# Patient Record
Sex: Male | Born: 1945 | Hispanic: No | Marital: Married
Health system: Southern US, Community
[De-identification: ages and names within clinical notes are randomized; demographics above are authoritative.]

---

## 2006-07-07 ENCOUNTER — Ambulatory Visit: Payer: Self-pay | Admitting: Cardiothoracic Surgery

## 2013-12-05 ENCOUNTER — Other Ambulatory Visit: Payer: Self-pay | Admitting: Internal Medicine

## 2013-12-05 DIAGNOSIS — N189 Chronic kidney disease, unspecified: Secondary | ICD-10-CM

## 2013-12-07 ENCOUNTER — Other Ambulatory Visit: Payer: Self-pay

## 2013-12-19 ENCOUNTER — Ambulatory Visit
Admission: RE | Admit: 2013-12-19 | Discharge: 2013-12-19 | Disposition: A | Discharge status: Home / Self Care | Payer: MEDICARE | Source: Ambulatory Visit | Attending: Internal Medicine | Admitting: Internal Medicine

## 2013-12-19 DIAGNOSIS — N189 Chronic kidney disease, unspecified: Secondary | ICD-10-CM

## 2015-09-10 IMAGING — US US RENAL
1 series · 14 of 25 positions shown · non-contrast
Comparison: None.

CLINICAL DATA: Chronic kidney disease.

EXAM:
RENAL/URINARY TRACT ULTRASOUND COMPLETE

[Series 1: us renal · 0.26mm/px · 14 of 44 slices shown]
[im 1/44]
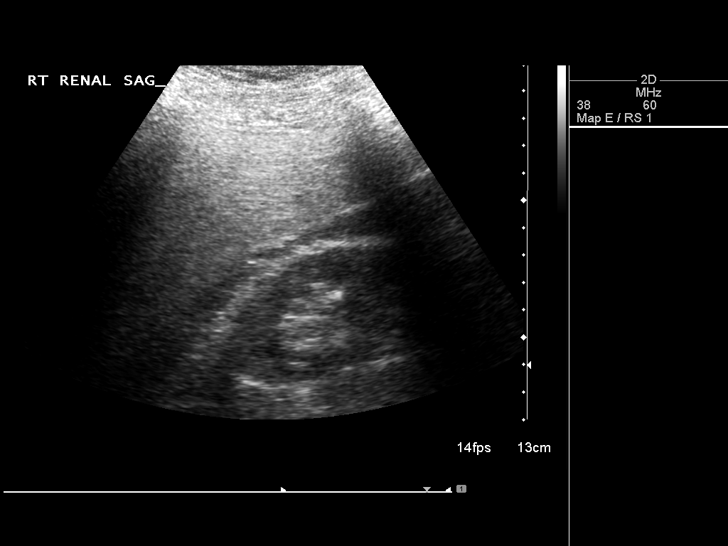
[im 4/44]
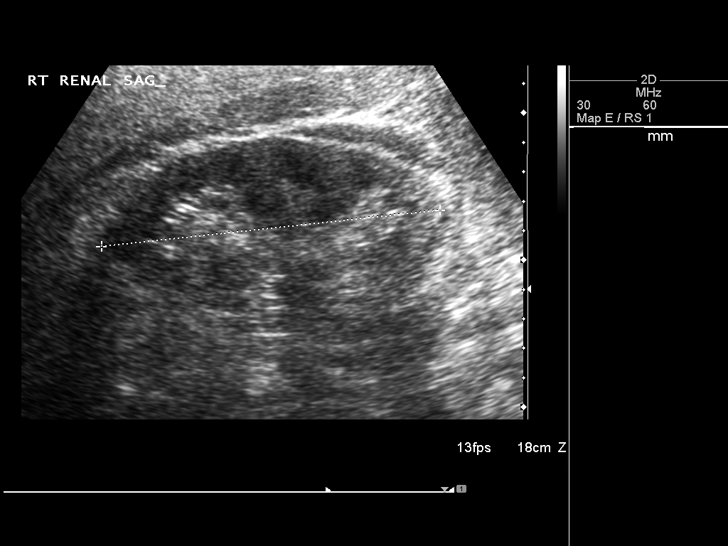
[im 8/44]
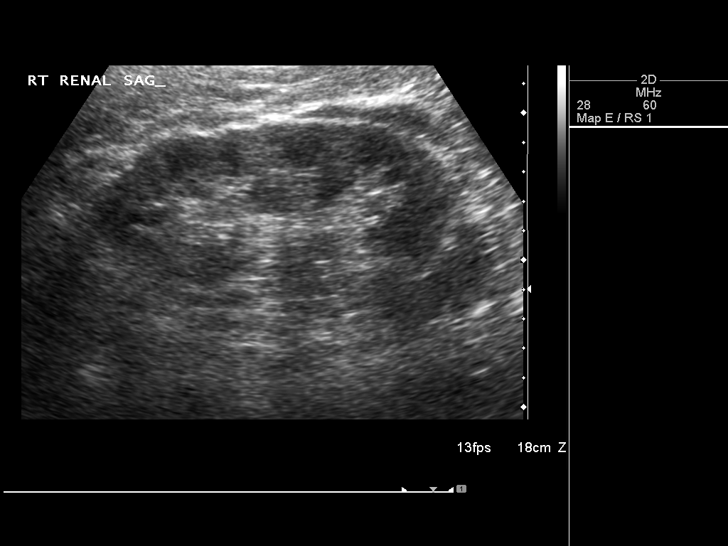
[im 11/44]
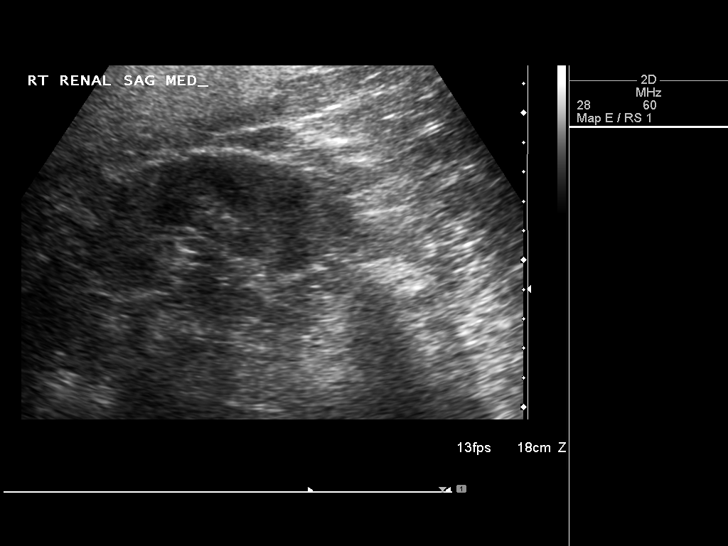
[im 15/44]
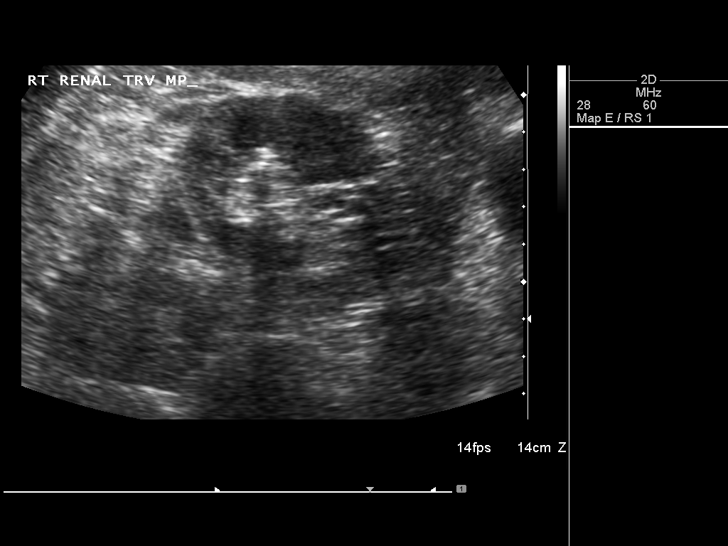
[im 17/44]
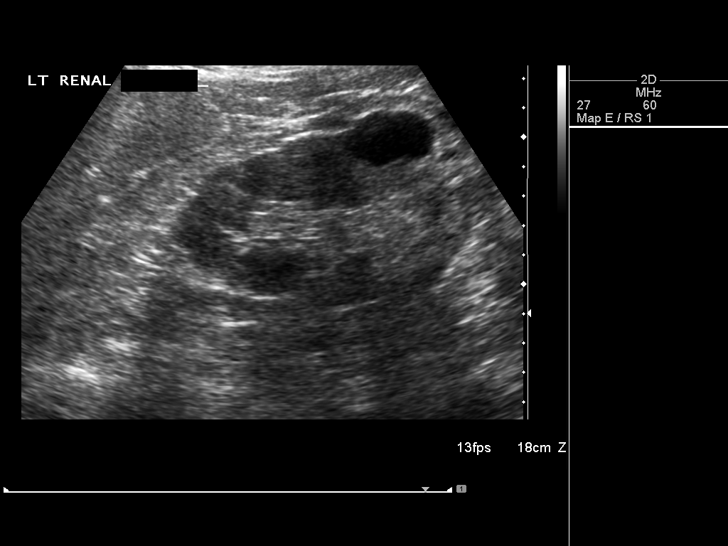
[im 20/44]
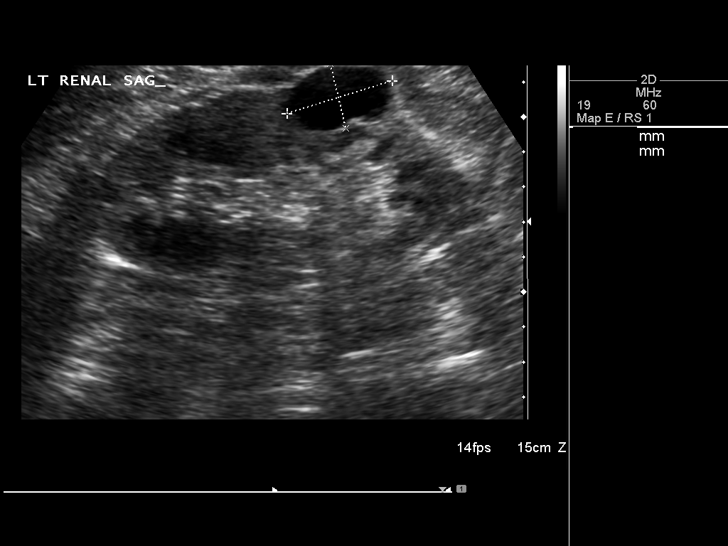
[im 24/44]
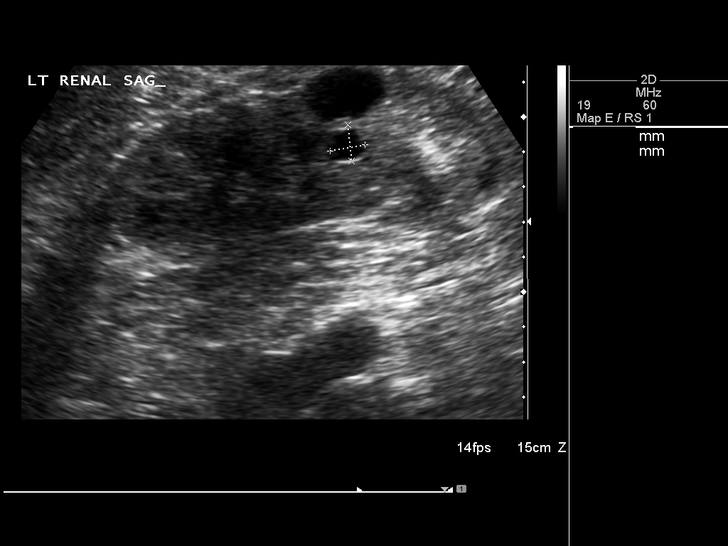
[im 27/44]
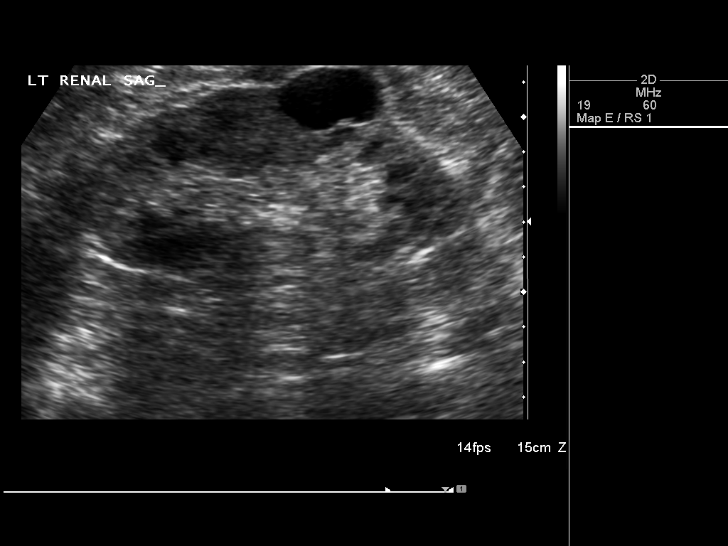
[im 29/44]
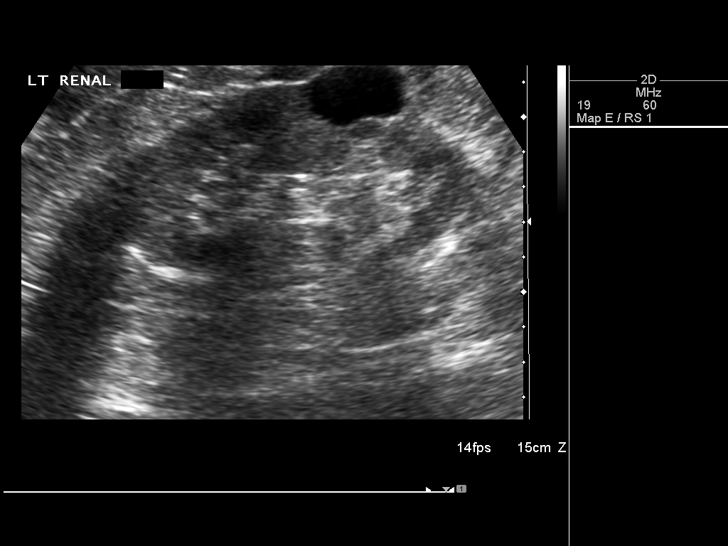
[im 33/44]
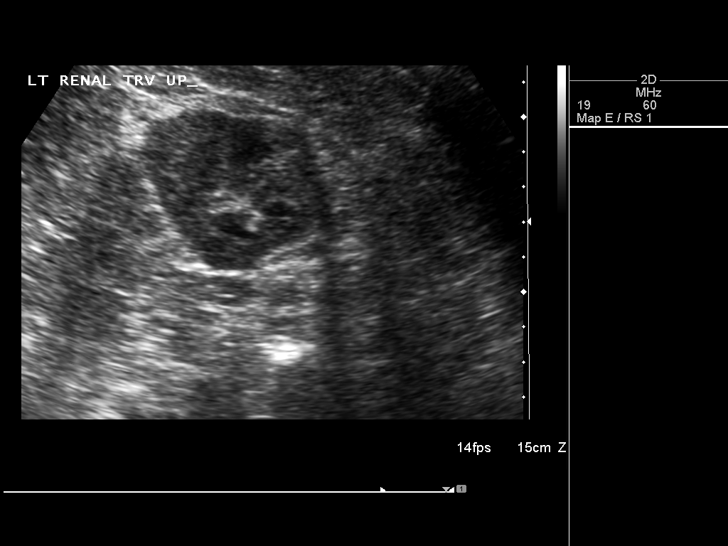
[im 36/44]
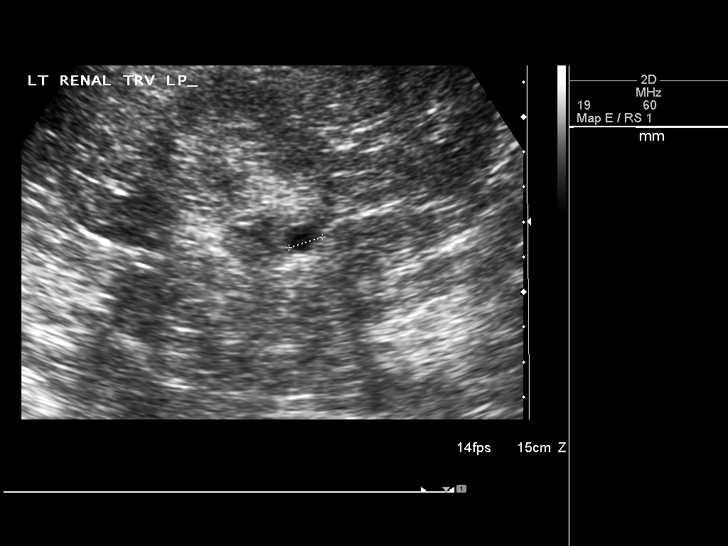
[im 40/44]
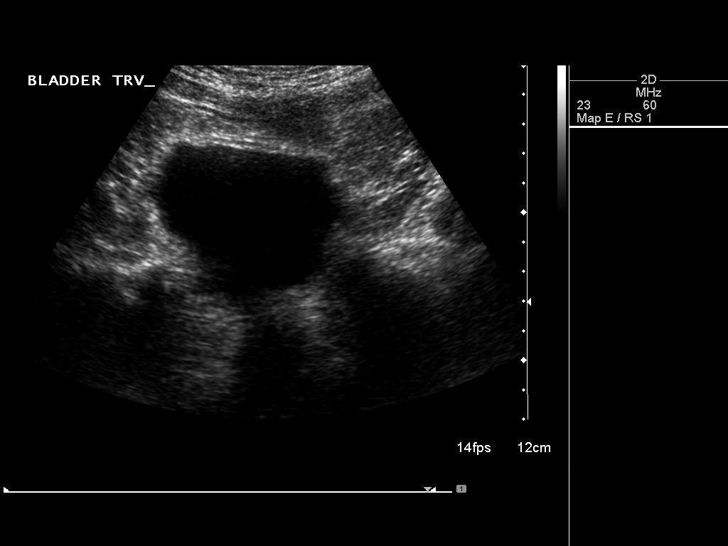
[im 44/44]
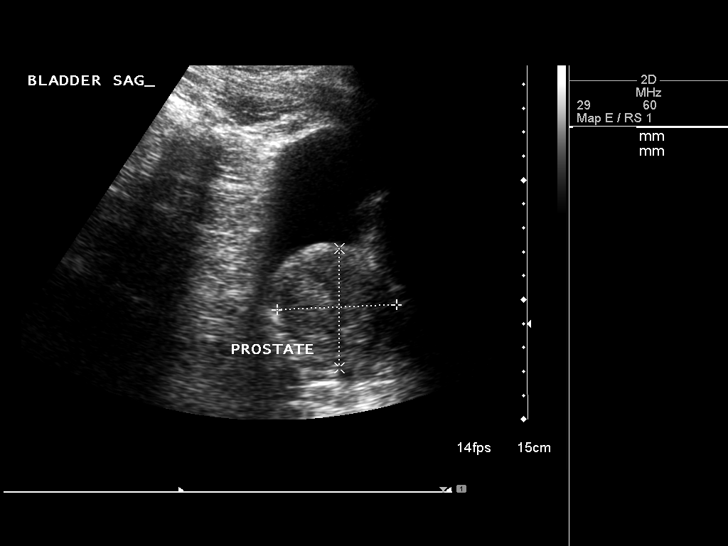

[14 of 25 positions shown; findings below may reference images not displayed]

FINDINGS: Right Kidney:

Length: 12.4 cm. Echogenicity within normal limits. No mass or
hydronephrosis visualized.

Left Kidney:

Length: 11.2 cm. 3.2 cm cyst arises from midpole. 1 cm cyst is seen
more medially in midpole. 1 cm cyst is seen in lower pole.
Echogenicity within normal limits. No mass or hydronephrosis
visualized.

Bladder:

Appears normal for degree of bladder distention. Prostatic
enlargement is noted.
IMPRESSION: Left renal cysts are noted. No hydronephrosis or renal obstruction
is noted. Prostatic enlargement is noted.

## 2019-01-11 DIAGNOSIS — Z01818 Encounter for other preprocedural examination: Secondary | ICD-10-CM
